# Patient Record
Sex: Female | Born: 1970 | Race: White | Hispanic: No | Marital: Married | Smoking: Never smoker
Health system: Southern US, Community
[De-identification: ages and names within clinical notes are randomized; demographics above are authoritative.]

## PROBLEM LIST (undated history)

## (undated) DIAGNOSIS — K219 Gastro-esophageal reflux disease without esophagitis: Secondary | ICD-10-CM

## (undated) DIAGNOSIS — G43909 Migraine, unspecified, not intractable, without status migrainosus: Secondary | ICD-10-CM

## (undated) DIAGNOSIS — E7800 Pure hypercholesterolemia, unspecified: Secondary | ICD-10-CM

## (undated) DIAGNOSIS — R109 Unspecified abdominal pain: Secondary | ICD-10-CM

## (undated) HISTORY — DX: Unspecified abdominal pain: R10.9

## (undated) HISTORY — DX: Gastro-esophageal reflux disease without esophagitis: K21.9

## (undated) HISTORY — PX: OVARIAN CYST REMOVAL: SHX89

## (undated) HISTORY — DX: Pure hypercholesterolemia, unspecified: E78.00

## (undated) HISTORY — PX: LAPAROSCOPIC OOPHERECTOMY: SHX6507

## (undated) HISTORY — DX: Migraine, unspecified, not intractable, without status migrainosus: G43.909

---

## 2017-04-05 ENCOUNTER — Emergency Department
Admission: EM | Admit: 2017-04-05 | Discharge: 2017-04-05 | Disposition: A | Discharge status: Home / Self Care | Payer: BLUE CROSS/BLUE SHIELD | Attending: Emergency Medicine | Admitting: Emergency Medicine

## 2017-04-05 ENCOUNTER — Encounter: Payer: Self-pay | Admitting: Emergency Medicine

## 2017-04-05 ENCOUNTER — Emergency Department: Payer: BLUE CROSS/BLUE SHIELD

## 2017-04-05 DIAGNOSIS — R0789 Other chest pain: Secondary | ICD-10-CM | POA: Insufficient documentation

## 2017-04-05 DIAGNOSIS — R110 Nausea: Secondary | ICD-10-CM | POA: Diagnosis not present

## 2017-04-05 DIAGNOSIS — Z79899 Other long term (current) drug therapy: Secondary | ICD-10-CM | POA: Diagnosis not present

## 2017-04-05 DIAGNOSIS — R079 Chest pain, unspecified: Secondary | ICD-10-CM

## 2017-04-05 LAB — CBC
HCT: 40.4 % (ref 35.0–47.0)
Hemoglobin: 13.7 g/dL (ref 12.0–16.0)
MCH: 29 pg (ref 26.0–34.0)
MCHC: 33.8 g/dL (ref 32.0–36.0)
MCV: 85.6 fL (ref 80.0–100.0)
PLATELETS: 319 10*3/uL (ref 150–440)
RBC: 4.72 MIL/uL (ref 3.80–5.20)
RDW: 13.9 % (ref 11.5–14.5)
WBC: 10.1 10*3/uL (ref 3.6–11.0)

## 2017-04-05 LAB — TROPONIN I: Troponin I: <0.03 ng/mL (ref <0.03)

## 2017-04-05 LAB — BASIC METABOLIC PANEL
Anion gap: 9 (ref 5–15)
BUN: 19 mg/dL (ref 6–20)
CALCIUM: 9.8 mg/dL (ref 8.9–10.3)
CHLORIDE: 105 mmol/L (ref 101–111)
CO2: 26 mmol/L (ref 22–32)
CREATININE: 1.01 mg/dL — AB (ref 0.44–1.00)
GFR calc non Af Amer: >60 mL/min (ref >60)
Glucose, Bld: 118 mg/dL — ABNORMAL HIGH (ref 65–99)
Potassium: 4.2 mmol/L (ref 3.5–5.1)
SODIUM: 140 mmol/L (ref 135–145)

## 2017-04-05 MED ORDER — ASPIRIN 81 MG PO CHEW
324.0000 mg | CHEWABLE_TABLET | Freq: Once | ORAL | Status: AC
  Administered 2017-04-05: 324 mg via ORAL
  Filled 2017-04-05: qty 4

## 2017-04-05 MED ORDER — NITROGLYCERIN 0.4 MG SL SUBL
0.4000 mg | SUBLINGUAL_TABLET | SUBLINGUAL | Status: DC | PRN
  Administered 2017-04-05: 0.4 mg via SUBLINGUAL
  Filled 2017-04-05: qty 1

## 2017-04-05 MED ORDER — GI COCKTAIL ~~LOC~~
30.0000 mL | Freq: Once | ORAL | Status: AC
  Administered 2017-04-05: 30 mL via ORAL
  Filled 2017-04-05: qty 30

## 2017-04-05 NOTE — ED Provider Notes (Signed)
[facility] Emergency Department Provider Note   ____________________________________________   First MD Initiated Contact with Patient 04/05/17 0407     (approximate)  I have reviewed the triage vital signs and the nursing notes.   HISTORY  Chief Complaint Chest Pain    HPI Kristie Meyer is a 46 y.o. female who comes into the hospital today with some tightness and pressure in her chest. She reports it started around 10 PM. She was getting ready for bed when this started. The patient was seen in [place] last week with some tightness and pressure in her epigastric area. She is being treated and reports that she was evaluated and is currently taking Dexacillin and meloxicam. She reports though that this is the first time at [facility] upper chest and she was concerned. The patient took some tramadol but it didn't help. She denies any radiation of the pain to her back neck or shoulders. The patient feels pressure when she lays back. She had an echocardiogram on Wednesday and is supposed to follow back up today. The patient had some slight nausea with no sweats but she did have chills. She is here for evaluation.The patient rates her pain a 4 out of 10 in intensity right now.   History reviewed. No pertinent past medical history.  There are no active problems to display for this patient.   Past Surgical History:  Procedure Laterality Date  . LAPAROSCOPIC OOPHERECTOMY    . OVARIAN CYST REMOVAL      Prior to Admission medications   Not on File    Allergies Patient has no known allergies.  No family history on file.  Social History Social History  Substance Use Topics  . Smoking status: Never Smoker  . Smokeless tobacco: Never Used  . Alcohol use No    Review of Systems  Constitutional: No fever/chills Eyes: No visual changes. ENT: No sore throat. Cardiovascular:  chest pain. Respiratory: Denies shortness of breath. Gastrointestinal:  Nausea with No abdominal pain.  no vomiting.  No diarrhea.  No constipation. Genitourinary: Negative for dysuria. Musculoskeletal: Negative for back pain. Skin: Negative for rash. Neurological: Negative for headaches, focal weakness or numbness.   ____________________________________________   PHYSICAL EXAM:  VITAL SIGNS: ED Triage Vitals  Enc Vitals Group     BP 04/05/17 0353 (!) 154/86     Pulse Rate 04/05/17 0353 67     Resp 04/05/17 0353 18     Temp 04/05/17 0353 98 F (36.7 C)     Temp Source 04/05/17 0353 Oral     SpO2 04/05/17 0353 100 %     Weight 04/05/17 0351 220 lb (99.8 kg)     Height 04/05/17 0351 5\' 3"  (1.6 m)     Head Circumference --      Peak Flow --      Pain Score 04/05/17 0351 4     Pain Loc --      Pain Edu? --      Excl. in GC? --     Constitutional: Alert and oriented. Well appearing and in Mild distress. Eyes: Conjunctivae are normal. PERRL. EOMI. Head: Atraumatic. Nose: No congestion/rhinnorhea. Mouth/Throat: Mucous membranes are moist.  Oropharynx non-erythematous. Cardiovascular: Normal rate, regular rhythm. Grossly normal heart sounds.  Good peripheral circulation. Respiratory: Normal respiratory effort.  No retractions. Lungs CTAB. Gastrointestinal: Soft and nontender. No distention. Positive bowel sounds Musculoskeletal: No lower extremity tenderness nor edema.   Neurologic:  Normal speech and language.  Skin:  Skin is warm, dry and intact. Marland Kitchen Psychiatric: Mood and affect are normal.  ____________________________________________   LABS (all labs ordered are listed, but only abnormal results are displayed)  Labs Reviewed  BASIC METABOLIC PANEL - Abnormal; Notable for the following:       Result Value   Glucose, Bld 118 (*)    Creatinine, Ser 1.01 (*)    All other components within normal limits  CBC  TROPONIN I  TROPONIN I   ____________________________________________  EKG  ED ECG REPORT I, Rebecka Apley, the  attending physician, personally viewed and interpreted this ECG.   Date: 04/05/2017  EKG Time: 348  Rate: 63  Rhythm: normal sinus rhythm  Axis: Normal  Intervals:none  ST&T Change: Biphasic T-wave in lead 3.  ____________________________________________  RADIOLOGY  CXR ____________________________________________   PROCEDURES  Procedure(s) performed: None  Procedures  Critical Care performed: No  ____________________________________________   INITIAL IMPRESSION / ASSESSMENT AND PLAN / ED COURSE  Pertinent labs & imaging results that were available during my care of the patient were reviewed by me and considered in my medical decision making (see chart for details).  This is a 46 year old female who comes into the hospital today with some chest pain. I will give the patient some aspirin and nitroglycerin to see if it resolves her pain at this time. I will await the results of the patient's blood work. She will be reassessed  Clinical Course as of Apr 06 723  Tue Apr 05, 2017  1610 No acute cardiopulmonary process seen. DG Chest 2 View [AW]    Clinical Course User Index [AW] Rebecka Apley, MD   The patient states that her pain is improved from previous. She does still have some discomfort but she reports it much improved than before. The patient has an appointment with her doctor today at 5:00. I encouraged her to follow up as she will be going to the results of her echocardiogram and her further testing. The patient will be discharged home to follow-up with her doctor. I will give the patient a GI cocktail as she has been treated for gastritis.  ____________________________________________   FINAL CLINICAL IMPRESSION(S) / ED DIAGNOSES  Final diagnoses:  Chest pain, unspecified type      NEW MEDICATIONS STARTED DURING THIS VISIT:  New Prescriptions   No medications on file     Note:  This document was prepared using Dragon voice recognition software  and may include unintentional dictation errors.    Rebecka Apley, MD 04/05/17 [phone]

## 2017-04-05 NOTE — ED Triage Notes (Signed)
Patient ambulatory to triage with steady gait, without difficulty or distress noted; pt reports upper CP tonight, nonradiating, [facility]; denies hx of same but has had a recent echo for mid epigastric pain (results pending)

## 2017-04-05 NOTE — ED Notes (Signed)
This RN to bedside at this time to introduce self to patient. Pt resting in bed at this time. Medication administered per MD order at this time. Pt visualized in NAD, respirations even and unlabored, skin warm, dry, and intact.

## 2017-04-05 NOTE — ED Notes (Signed)
NAD noted at time of D/C. Pt denies questions or concerns. Pt ambulatory to the lobby at this time. Pt refused wheelchair to the lobby.  

## 2017-04-05 NOTE — Discharge Instructions (Signed)
Please follow up with your physician for further evaluation of your chest pain. Also please follow up with cardiology

## 2017-04-05 NOTE — ED Notes (Signed)
Pt went to X-ray.   

## 2018-10-24 ENCOUNTER — Other Ambulatory Visit: Payer: Self-pay | Admitting: Family Medicine

## 2018-10-24 DIAGNOSIS — R251 Tremor, unspecified: Secondary | ICD-10-CM

## 2018-11-13 ENCOUNTER — Other Ambulatory Visit: Payer: Self-pay | Admitting: Family Medicine

## 2018-11-15 ENCOUNTER — Other Ambulatory Visit: Payer: BLUE CROSS/BLUE SHIELD

## 2018-11-21 ENCOUNTER — Other Ambulatory Visit: Payer: Self-pay | Admitting: Family Medicine

## 2018-11-22 ENCOUNTER — Other Ambulatory Visit: Payer: BLUE CROSS/BLUE SHIELD

## 2018-11-28 ENCOUNTER — Other Ambulatory Visit: Payer: BLUE CROSS/BLUE SHIELD

## 2018-12-18 ENCOUNTER — Ambulatory Visit
Admission: RE | Admit: 2018-12-18 | Discharge: 2018-12-18 | Disposition: A | Discharge status: Home / Self Care | Payer: BLUE CROSS/BLUE SHIELD | Source: Ambulatory Visit | Attending: Family Medicine | Admitting: Family Medicine

## 2018-12-18 DIAGNOSIS — R251 Tremor, unspecified: Secondary | ICD-10-CM

## 2018-12-18 MED ORDER — GADOBENATE DIMEGLUMINE 529 MG/ML IV SOLN
20.0000 mL | Freq: Once | INTRAVENOUS | Status: AC | PRN
  Administered 2018-12-18: 20 mL via INTRAVENOUS

## 2019-01-09 ENCOUNTER — Encounter: Payer: Self-pay | Admitting: Neurology

## 2019-01-09 ENCOUNTER — Ambulatory Visit (INDEPENDENT_AMBULATORY_CARE_PROVIDER_SITE_OTHER): Payer: BLUE CROSS/BLUE SHIELD | Admitting: Neurology

## 2019-01-09 VITALS — BP 130/85 | HR 76 | Ht 63.0 in | Wt 213.0 lb

## 2019-01-09 DIAGNOSIS — G250 Essential tremor: Secondary | ICD-10-CM

## 2019-01-09 NOTE — Progress Notes (Signed)
Subjective:    Patient ID: Kristie Meyer is a 48 y.o. female.  HPI     Kristie Foley, MD, PhD [facility] [address] [address] [place], [place] [address]  Dear Dr. Ria Comment,   I saw your patient, Kristie Meyer, upon your kind request in my neurologic clinic today for initial consultation of her tremors. The patient is unaccompanied today. As you know, Ms. Kristie Meyer is a 48 year old right-handed woman with an underlying medical history of headaches, vitamin D deficiency, reflux disease, hyperlipidemia, and obesity, who reports a head tremor for the past several months, perhaps a year, per husband's feedback to her. During a family vacation she was told that she may have had a head tremor for the past 2 or even 3 years per family report. She does not report a family history of tremor but is not aware of her dad's side of the family history, she did not actually know her biological father. She was adopted by her current father when she was about 48 years old. She has 2 half brothers, none with tremors. I reviewed your office note from 12/26/2018, which you kindly included. She had a head CT recently but results are not available for my review today. She had a recent brain MRI with and without contrast on 12/18/2018 and I reviewed the results: IMPRESSION: Slight premature atrophy. No acute intracranial findings. No abnormal postcontrast enhancement. No specific cause for head tremors is identified. Sinus disease as described. She does not smoke, does not drink alcohol, caffeine occasionally. She tries to hydrate well. She cleans houses for work. She has noticed no significant hand tremor with the exception of an occasional trembling when she is nervous. Her head tremor also becomes worse when she is stressed out or nervous.    Her Past Medical History Is Significant For: Past Medical History:  Diagnosis Date  . Abdominal pain, acute   . GERD (gastroesophageal  reflux disease)   . Hypercholesteremia   . Migraine headache     Her Past Surgical History Is Significant For: Past Surgical History:  Procedure Laterality Date  . LAPAROSCOPIC OOPHERECTOMY    . OVARIAN CYST REMOVAL      Her Family History Is Significant For: No family history on file.  Her Social History Is Significant For: Social History   Socioeconomic History  . Marital status: Married    Spouse name: Not on file  . Number of children: Not on file  . Years of education: Not on file  . Highest education level: Not on file  Occupational History  . Not on file  Social Needs  . Financial resource strain: Not on file  . Food insecurity:    Worry: Not on file    Inability: Not on file  . Transportation needs:    Medical: Not on file    Non-medical: Not on file  Tobacco Use  . Smoking status: Never Smoker  . Smokeless tobacco: Never Used  Substance and Sexual Activity  . Alcohol use: No  . Drug use: Not on file  . Sexual activity: Not on file  Lifestyle  . Physical activity:    Days per week: Not on file    Minutes per session: Not on file  . Stress: Not on file  Relationships  . Social connections:    Talks on phone: Not on file    Gets together: Not on file    Attends religious service: Not on file  Active member of club or organization: Not on file    Attends meetings of clubs or organizations: Not on file    Relationship status: Not on file  Other Topics Concern  . Not on file  Social History Narrative  . Not on file    Her Allergies Are:  Allergies  Allergen Reactions  . Latex   :   Her Current Medications Are:  Outpatient Encounter Medications as of 01/09/2019  Medication Sig  . omeprazole (PRILOSEC) 40 MG capsule Take 40 mg by mouth daily.  . [DISCONTINUED] dexlansoprazole (DEXILANT) 60 MG capsule Take 60 mg by mouth daily.  . [DISCONTINUED] meloxicam (MOBIC) 15 MG tablet Take 15 mg by mouth daily.  . [DISCONTINUED] traMADol (ULTRAM) 50 MG  tablet Take by mouth.   No facility-administered encounter medications on file as of 01/09/2019.   :   Review of Systems:  Out of a complete 14 point review of systems, all are reviewed and negative with the exception of these symptoms as listed below:  Review of Systems  Neurological:       Pt presents today to discuss her head and neck tremors. Pt reports that she has intermittent hand tremors. Pt is right handed.    Objective:  Neurological Exam  Physical Exam Physical Examination:   Vitals:   01/09/19 1012  BP: 130/85  Pulse: 76   General Examination: The patient is a very pleasant 48 y.o. female in no acute distress. She appears well-developed and well-nourished and well groomed.   HEENT: Normocephalic, atraumatic, pupils are equal, round and reactive to light and accommodation. Extraocular tracking is good without limitation to gaze excursion or nystagmus noted. Normal smooth pursuit is noted. Hearing is grossly intact. Face is symmetric with normal facial animation. Speech is clear with no dysarthria noted. There is no hypophonia. There is no lip, jaw or voice tremor. She has an intermittent head tremor, it is side to side. It is not continually noticeable, perhaps slightly distractible. Overall, it is mild. Neck is supple with full range of passive and active motion. There are no carotid bruits on auscultation. Oropharynx exam reveals: moderate mouth dryness, adequate dental hygiene. Tongue protrudes centrally and palate elevates symmetrically.   Chest: Clear to auscultation without wheezing, rhonchi or crackles noted.  Heart: S1+S2+0, regular and normal without murmurs, rubs or gallops noted.   Abdomen: Soft, non-tender and non-distended with normal bowel sounds appreciated on auscultation.  Extremities: There is no pitting edema in the distal lower extremities bilaterally. Pedal pulses are intact.  Skin: Warm and dry without trophic changes noted. There are no  varicosities.  Musculoskeletal: exam reveals no obvious joint deformities, tenderness or joint swelling or erythema.   Neurologically:  Mental status: The patient is awake, alert and oriented in all 4 spheres. Her immediate and remote memory, attention, language skills and fund of knowledge are appropriate. There is no evidence of aphasia, agnosia, apraxia or anomia. Speech is clear with normal prosody and enunciation. Thought process is linear. Mood is normal and affect is normal.  Cranial nerves II - XII are as described above under HEENT exam. In addition: shoulder shrug is normal with equal shoulder height noted. Motor exam: Normal bulk, strength and tone is noted. There is no resting tremor.  On 01/09/2019: on Archimedes spiral drawing, she has slight insecurity with her nondominant hand. Otherwise no significant tremor noted, handwriting is legible, not particularly tremulous, not micrographic. She has no significant postural or action tremor in her hands. She  has no lower extremity tremors. Romberg is negative. Reflexes are 2+ throughout. Babinski: Toes are flexor bilaterally. Fine motor skills and coordination: intact with normal finger taps, normal hand movements, normal rapid alternating patting, normal foot taps and normal foot agility.  Cerebellar testing: No dysmetria or intention tremor on finger to nose testing. Heel to shin is unremarkable bilaterally. There is no truncal or gait ataxia.  Sensory exam: intact to light touch, vibration, temperature in the upper and lower extremities.  Gait, station and balance: She stands easily. No veering to one side is noted. No leaning to one side is noted. Posture is age-appropriate and stance is narrow based. Gait shows normal stride length and normal pace. No problems turning are noted. Tandem walk is unremarkable.   Assessment and Plan:   In summary, Melana Rosmeri Peetz is a very pleasant 48 y.o.-year old female  with an underlying medical  history of headaches, vitamin D deficiency, reflux disease, hyperlipidemia, and obesity, who presents for evaluation of her head tremor. She has a mild and intermittent head tremor, no telltale signs of parkinsonism and is reassured in that regard. She has no significant hand tremor at this time. An isolated had tremors often benign. She may have a family history of tremor on her biological father side, history is unknown on that side. She is largely reassured. I would not favor trying any medication for an isolated had tremor as this can be difficult to treat and medications could potentially cause side effects before they may help with tremor. She's not particularly bothered by it. She is not limited in her activities of daily living from her tremor. At this juncture, I suggested as needed follow-up. She had no sinister findings on her recent brain MRI. She does report worsening of tremor with anxiety or stress. This is common trigger for exacerbation of any type of tremor. She is reminded to stay well-hydrated with water. From my end of things she can follow-up as needed. I answered all her questions today and she was in agreement. Thank you very much for allowing me to participate in the care of this nice patient. If I can be of any further assistance to you please do not hesitate to call me at [phone].  Sincerely,   Kristie Foley, MD, PhD

## 2019-01-09 NOTE — Patient Instructions (Signed)
You have a mild and intermittent head tremor.  You have no signs of parkinsonism, thankfully.  You may have a family history of tremors on your biological father's side.  I would not recommend any medication for your head tremor, as medications can cause side effects and head tremor does not respond very well to medication typically.   I can see you back as needed.

## 2019-03-29 ENCOUNTER — Other Ambulatory Visit: Payer: Self-pay

## 2019-03-29 ENCOUNTER — Emergency Department
Admission: EM | Admit: 2019-03-29 | Discharge: 2019-03-30 | Disposition: A | Discharge status: Home / Self Care | Payer: BLUE CROSS/BLUE SHIELD | Attending: Emergency Medicine | Admitting: Emergency Medicine

## 2019-03-29 ENCOUNTER — Telehealth: Payer: Self-pay

## 2019-03-29 ENCOUNTER — Encounter: Payer: Self-pay | Admitting: Emergency Medicine

## 2019-03-29 ENCOUNTER — Emergency Department: Payer: BLUE CROSS/BLUE SHIELD

## 2019-03-29 DIAGNOSIS — R059 Cough, unspecified: Secondary | ICD-10-CM

## 2019-03-29 DIAGNOSIS — Z79899 Other long term (current) drug therapy: Secondary | ICD-10-CM | POA: Diagnosis not present

## 2019-03-29 DIAGNOSIS — R05 Cough: Secondary | ICD-10-CM

## 2019-03-29 DIAGNOSIS — R0789 Other chest pain: Secondary | ICD-10-CM

## 2019-03-29 DIAGNOSIS — J069 Acute upper respiratory infection, unspecified: Secondary | ICD-10-CM | POA: Insufficient documentation

## 2019-03-29 DIAGNOSIS — Z20828 Contact with and (suspected) exposure to other viral communicable diseases: Secondary | ICD-10-CM | POA: Diagnosis not present

## 2019-03-29 LAB — CBC
HCT: 39.4 % (ref 36.0–46.0)
Hemoglobin: 13.3 g/dL (ref 12.0–15.0)
MCH: 28.9 pg (ref 26.0–34.0)
MCHC: 33.8 g/dL (ref 30.0–36.0)
MCV: 85.7 fL (ref 80.0–100.0)
Platelets: 331 10*3/uL (ref 150–400)
RBC: 4.6 MIL/uL (ref 3.87–5.11)
RDW: 12.8 % (ref 11.5–15.5)
WBC: 13.5 10*3/uL — ABNORMAL HIGH (ref 4.0–10.5)
nRBC: 0 % (ref 0.0–0.2)

## 2019-03-29 LAB — BASIC METABOLIC PANEL
Anion gap: 10 (ref 5–15)
BUN: 16 mg/dL (ref 6–20)
CO2: 25 mmol/L (ref 22–32)
Calcium: 9.7 mg/dL (ref 8.9–10.3)
Chloride: 104 mmol/L (ref 98–111)
Creatinine, Ser: 1.07 mg/dL — ABNORMAL HIGH (ref 0.44–1.00)
GFR calc Af Amer: >60 mL/min (ref >60)
GFR calc non Af Amer: >60 mL/min (ref >60)
Glucose, Bld: 126 mg/dL — ABNORMAL HIGH (ref 70–99)
Potassium: 4.1 mmol/L (ref 3.5–5.1)
Sodium: 139 mmol/L (ref 135–145)

## 2019-03-29 LAB — TROPONIN I: Troponin I: <0.03 ng/mL (ref <0.03)

## 2019-03-29 NOTE — Telephone Encounter (Signed)
Patient called the [facility] line undecided wheather or not she should go to the Emergency Room due to having chest pain. The patient reported that she has been having chest pain and tightness since 03/26/2019.  She said that her temperature was 99.4, little coughing and some diarrhea.   Patient reported that she hasn't been able to sleep since 03/26/2019.  Advised patient to go the ED to get checked to make sure that everything is okay.  Patient stated that she will go to [facility] ED, "it's only 10 mins from my home."

## 2019-03-29 NOTE — ED Triage Notes (Signed)
Patients waking up Monday night with pressure in middle of chest Denies radiation. Denies SOB, nausea or dizziness. Patient states she has had worsening cough over past few days. History of recurrent bronchitis.

## 2019-03-30 MED ORDER — BENZONATATE 100 MG PO CAPS: 100.0000 mg | ORAL_CAPSULE | Freq: Four times a day (QID) | ORAL | 0 refills | Status: AC | PRN

## 2019-03-30 NOTE — Discharge Instructions (Signed)
Return to the emergency room for any new or worrisome symptoms including low oxygen saturation, worsening cough, worsening chest pain, if you change your mind about further work-up about your chest pain, if you have persistent high fever.  We do recommend that you purchase a pulse ox machine and keep track of your oxygen saturation, if it goes below 91 consider seeking help.  In addition, We do suggest that you the emergency room if you have any change in your chest pain, or other concerns.  Please quarantine yourself from other people until your coronavirus test comes back in the next few days.  Follow-up closely with PCP if you do not have one please follow-up with the clinic listed above.

## 2019-03-30 NOTE — ED Provider Notes (Signed)
[facility] Emergency Department Provider Note  ____________________________________________   I have reviewed the triage vital signs and the nursing notes. Where available I have reviewed prior notes and, if possible and indicated, outside hospital notes.    HISTORY  Chief Complaint Cough and Chest Pain    HPI Kristie Meyer is a 48 y.o. female  patient seen and evaluated during the coronavirus epidemic during a time with low staffing history of reflux disease, history of "bronchitis" x2 in the last 8 months, no personal or family history of PE or DVT, has no tobacco abuse history, no known history of COPD, does have a history of hypercholesterol and migraine headaches, essential tremor, states that she has been coughing for last 2 to 3 days.  And when she coughs it hurts her chest.  It is in the costochondral margin as she indicates with her hand where it hurts.  Hurts to change position, hurts when she touches it, she does not have exertional discomfort.  Actually worse when she is lying down coughing.  No leg swelling no recent travel no recent surgery, she is not on exogenous estrogens.  She states that this pain is the same pain she gets every time she gets "bronchitis" which to her is a rhinorrhea and cough.  She is also worried that she may have the coronavirus.  She has no known exposures to it no recent travel.  Patient has this discomfort which is nonradiating, sharp in nature, persistent since Monday, with no variation except for those listed above.  No other alleviating or aggravating symptoms no exertional dyspnea.  Denies history of CAD did have a large chest pain work-up a few years ago for what was decided to be reflux disease.  Denies any abdominal pain nausea vomiting or change in stooling etc.    Past Medical History:  Diagnosis Date  . Abdominal pain, acute   . GERD (gastroesophageal reflux disease)   . Hypercholesteremia   . Migraine headache      There are no active problems to display for this patient.   Past Surgical History:  Procedure Laterality Date  . LAPAROSCOPIC OOPHERECTOMY    . OVARIAN CYST REMOVAL      Prior to Admission medications   Medication Sig Start Date End Date Taking? Authorizing Provider  omeprazole (PRILOSEC) 40 MG capsule Take 40 mg by mouth daily.   Yes [provider]    Allergies Latex  No family history on file.  Social History Social History   Tobacco Use  . Smoking status: Never Smoker  . Smokeless tobacco: Never Used  Substance Use Topics  . Alcohol use: No  . Drug use: Not on file    Review of Systems Constitutional: No fever/chills Eyes: No visual changes. ENT: No sore throat. No stiff neck no neck pain Cardiovascular: See HPI Respiratory: Denies shortness of breath. Gastrointestinal:   no vomiting.  No diarrhea.  No constipation. Genitourinary: Negative for dysuria. Musculoskeletal: Negative lower extremity swelling Skin: Negative for rash. Neurological: Negative for severe headaches, focal weakness or numbness.   ____________________________________________   PHYSICAL EXAM:  VITAL SIGNS: ED Triage Vitals  Enc Vitals Group     BP 03/29/19 2331 (!) 153/87     Pulse Rate 03/29/19 2331 77     Resp 03/29/19 2331 18     Temp 03/29/19 2331 99.3 F (37.4 C)     Temp Source 03/29/19 2331 Oral     SpO2 03/29/19 2331 97 %  Weight 03/29/19 2226 215 lb (97.5 kg)     Height 03/29/19 2226  (1.6 m)     Head Circumference --      Peak Flow --      Pain Score 03/29/19 2226 6     Pain Loc --      Pain Edu? --      Excl. in GC? --     Constitutional: Alert and oriented. Well appearing and in no acute distress. Eyes: Conjunctivae are normal Head: Atraumatic HEENT: Mild clear congestion/rhinnorhea. Mucous membranes are moist.  Oropharynx non-erythematous Neck:   Nontender with no meningismus, no masses, no stridor Cardiovascular: Normal rate, regular  rhythm. Grossly normal heart sounds.  Good peripheral circulation. Respiratory: Normal respiratory effort.  No retractions. Lungs CTAB. : Tender palpation along the left costochondral margin which reproduces the patient's discomfort.  There is no crepitus there is no flail chest there is no masses or lesions or erythema noted.  It is not consistent with shingles.  There is no rib fracture palpated.  Does exactly reproduce her discomfort when I touch this area when she touch there she states "ouch that the pain right there" and pulls back.  Also reproducible when she changes position in the bed pulls up on the bed or raises her arm.  This right at the insertion of the pectoralis muscle on the left. Abdominal: Soft and nontender. No distention. No guarding no rebound Back:  There is no focal tenderness or step off.  there is no midline tenderness there are no lesions noted. there is no CVA tenderness Musculoskeletal: No lower extremity tenderness, no upper extremity tenderness. No joint effusions, no DVT signs strong distal pulses no edema Neurologic:  Normal speech and language. No gross focal neurologic deficits are appreciated.  Skin:  Skin is warm, dry and intact. No rash noted. Psychiatric: Mood and affect are normal. Speech and behavior are normal.  ____________________________________________   LABS (all labs ordered are listed, but only abnormal results are displayed)  Labs Reviewed  BASIC METABOLIC PANEL - Abnormal; Notable for the following components:      Result Value   Glucose, Bld 126 (*)    Creatinine, Ser 1.07 (*)    All other components within normal limits  CBC - Abnormal; Notable for the following components:   WBC 13.5 (*)    All other components within normal limits  NOVEL CORONAVIRUS, NAA (HOSPITAL ORDER, SEND-OUT TO REF LAB)  TROPONIN I    Pertinent labs  results that were available during my care of the patient were reviewed by me and considered in my medical  decision making (see chart for details). ____________________________________________  EKG  I personally interpreted any EKGs ordered by me or triage Normal sinus rhythm rate 85 bpm there are nonspecific ST changes which appear to be similar to prior.  Normal axis. ____________________________________________  RADIOLOGY  Pertinent labs & imaging results that were available during my care of the patient were reviewed by me and considered in my medical decision making (see chart for details). If possible, patient and/or family made aware of any abnormal findings.  Dg Chest Portable 1 View  Result Date: 03/29/2019 CLINICAL DATA:  Cough and chest pain EXAM: PORTABLE CHEST 1 VIEW COMPARISON:  04/05/2017 FINDINGS: The heart size and mediastinal contours are within normal limits. Both lungs are clear. The visualized skeletal structures are unremarkable. IMPRESSION: No active disease. Electronically Signed   By: Deatra Robinson M.D.   On: 03/29/2019 22:55  ____________________________________________    PROCEDURES  Procedure(s) performed: None  Procedures  Critical Care performed: None  ____________________________________________   INITIAL IMPRESSION / ASSESSMENT AND PLAN / ED COURSE  Pertinent labs & imaging results that were available during my care of the patient were reviewed by me and considered in my medical decision making (see chart for details).  Patient with very reproducible chest wall pain, did consider but do not think present ACS PE dissection myocarditis endocarditis pneumonia pneumothorax and other intrathoracic pathology nor at this time do I think it likely the patient has referred abdominal pain causing this discomfort.  This is a mild rhinorrhea/early cough presentation with chest wall pain with cough which is very consistent with prior.  Is in no acute distress, she and I had a long talk about serial cardiac enzymes.  Her strong preference would not be to stay and  have them then although they were offered to her.  She understands limitation of the work-up in this context.  She states that she is pretty sure she just pulled a muscle coughing.  Stressed the cough is concerned, there is no clinical evidence to support pneumonia, she is not hypoxic there is no indication for admission per current coronavirus standards.  We will send an outpatient coronavirus test.  I have advised her to self quarantine pending those results and she states she will do so.  Extensive return precautions given for her chest pain and cough and she is very comfortable with this plan.  I have given her extensive instructions about returning and following up.  Advised that she buy herself a pulse ox machine to keep track of her oxygenation in the event that this does turn out to be the coronavirus.  Antibiotics are clearly not indicated at this patient with a mild cough with no chest x-ray findings and normal sats, At this time, there does not appear to be clinical evidence to support the diagnosis of pulmonary embolus, dissection, myocarditis, endocarditis, pericarditis, pericardial tamponade, acute coronary syndrome, pneumothorax, pneumonia, or any other acute intrathoracic pathology that will require admission or acute intervention. Nor is there evidence of any significant intra-abdominal pathology causing this discomfort.    ____________________________________________   FINAL CLINICAL IMPRESSION(S) / ED DIAGNOSES  Final diagnoses:  None      This chart was dictated using voice recognition software.  Despite best efforts to proofread,  errors can occur which can change meaning.      Jeanmarie PlantMcShane, James A, MD 03/30/19 [phone]

## 2019-03-31 LAB — NOVEL CORONAVIRUS, NAA (HOSP ORDER, SEND-OUT TO REF LAB; TAT 18-24 HRS): SARS-CoV-2, NAA: NOT DETECTED

## 2020-02-18 ENCOUNTER — Other Ambulatory Visit: Payer: Self-pay | Admitting: Internal Medicine

## 2020-02-18 DIAGNOSIS — Z1231 Encounter for screening mammogram for malignant neoplasm of breast: Secondary | ICD-10-CM

## 2020-02-26 ENCOUNTER — Ambulatory Visit
Admission: RE | Admit: 2020-02-26 | Discharge: 2020-02-26 | Disposition: A | Discharge status: Home / Self Care | Payer: BC Managed Care – PPO | Source: Ambulatory Visit | Attending: Internal Medicine | Admitting: Internal Medicine

## 2020-02-26 DIAGNOSIS — Z1231 Encounter for screening mammogram for malignant neoplasm of breast: Secondary | ICD-10-CM | POA: Insufficient documentation

## 2020-12-03 ENCOUNTER — Other Ambulatory Visit: Payer: Self-pay | Admitting: Surgery

## 2020-12-03 DIAGNOSIS — E042 Nontoxic multinodular goiter: Secondary | ICD-10-CM

## 2021-02-23 ENCOUNTER — Ambulatory Visit
Admission: RE | Admit: 2021-02-23 | Discharge: 2021-02-23 | Disposition: A | Discharge status: Home / Self Care | Payer: BC Managed Care – PPO | Source: Ambulatory Visit | Attending: Surgery | Admitting: Surgery

## 2021-02-23 ENCOUNTER — Other Ambulatory Visit: Payer: Self-pay

## 2021-02-23 DIAGNOSIS — E042 Nontoxic multinodular goiter: Secondary | ICD-10-CM | POA: Insufficient documentation

## 2021-05-05 ENCOUNTER — Other Ambulatory Visit: Payer: Self-pay | Admitting: Internal Medicine

## 2021-05-05 DIAGNOSIS — Z1231 Encounter for screening mammogram for malignant neoplasm of breast: Secondary | ICD-10-CM

## 2021-06-19 ENCOUNTER — Other Ambulatory Visit: Payer: Self-pay | Admitting: Obstetrics and Gynecology

## 2021-06-19 DIAGNOSIS — Z1231 Encounter for screening mammogram for malignant neoplasm of breast: Secondary | ICD-10-CM

## 2021-07-06 ENCOUNTER — Other Ambulatory Visit: Payer: Self-pay

## 2021-07-06 ENCOUNTER — Ambulatory Visit
Admission: RE | Admit: 2021-07-06 | Discharge: 2021-07-06 | Disposition: A | Discharge status: Home / Self Care | Payer: BC Managed Care – PPO | Source: Ambulatory Visit | Attending: Obstetrics and Gynecology | Admitting: Obstetrics and Gynecology

## 2021-07-06 DIAGNOSIS — Z1231 Encounter for screening mammogram for malignant neoplasm of breast: Secondary | ICD-10-CM | POA: Insufficient documentation

## 2021-09-04 ENCOUNTER — Emergency Department: Payer: BC Managed Care – PPO

## 2021-09-04 ENCOUNTER — Emergency Department
Admission: EM | Admit: 2021-09-04 | Discharge: 2021-09-05 | Disposition: A | Discharge status: Home / Self Care | Payer: BC Managed Care – PPO | Attending: Emergency Medicine | Admitting: Emergency Medicine

## 2021-09-04 ENCOUNTER — Other Ambulatory Visit: Payer: Self-pay

## 2021-09-04 DIAGNOSIS — Z91040 Latex allergy status: Secondary | ICD-10-CM | POA: Diagnosis not present

## 2021-09-04 DIAGNOSIS — R197 Diarrhea, unspecified: Secondary | ICD-10-CM | POA: Insufficient documentation

## 2021-09-04 DIAGNOSIS — R270 Ataxia, unspecified: Secondary | ICD-10-CM | POA: Insufficient documentation

## 2021-09-04 LAB — BASIC METABOLIC PANEL
Anion gap: 11 (ref 5–15)
BUN: 18 mg/dL (ref 6–20)
CO2: 26 mmol/L (ref 22–32)
Calcium: 9.6 mg/dL (ref 8.9–10.3)
Chloride: 101 mmol/L (ref 98–111)
Creatinine, Ser: 1.04 mg/dL — ABNORMAL HIGH (ref 0.44–1.00)
GFR, Estimated: >60 mL/min (ref >60)
Glucose, Bld: 113 mg/dL — ABNORMAL HIGH (ref 70–99)
Potassium: 4.2 mmol/L (ref 3.5–5.1)
Sodium: 138 mmol/L (ref 135–145)

## 2021-09-04 LAB — URINALYSIS, ROUTINE W REFLEX MICROSCOPIC
Bilirubin Urine: NEGATIVE
Glucose, UA: NEGATIVE mg/dL
Hgb urine dipstick: NEGATIVE
Ketones, ur: NEGATIVE mg/dL
Leukocytes,Ua: NEGATIVE
Nitrite: NEGATIVE
Protein, ur: NEGATIVE mg/dL
Specific Gravity, Urine: 1.01 (ref 1.005–1.030)
pH: 5 (ref 5.0–8.0)

## 2021-09-04 LAB — CBC
HCT: 43.2 % (ref 36.0–46.0)
Hemoglobin: 14.4 g/dL (ref 12.0–15.0)
MCH: 29.2 pg (ref 26.0–34.0)
MCHC: 33.3 g/dL (ref 30.0–36.0)
MCV: 87.6 fL (ref 80.0–100.0)
Platelets: 298 10*3/uL (ref 150–400)
RBC: 4.93 MIL/uL (ref 3.87–5.11)
RDW: 12.7 % (ref 11.5–15.5)
WBC: 7.7 10*3/uL (ref 4.0–10.5)
nRBC: 0 % (ref 0.0–0.2)

## 2021-09-04 NOTE — ED Provider Notes (Addendum)
[facility]  ____________________________________________   Event Date/Time   First MD Initiated Contact with Patient 09/04/21 1318     (approximate)  I have reviewed the triage vital signs and the nursing notes.   HISTORY  Chief Complaint Dizziness    HPI Nazyia Brit Carbonell is a 50 y.o. female pmh GERD, HTN, migraines presents with unsteadiness.  Patient notes that approximately 10 days ago she had acute onset of feeling unsteady on her feet.  She feels well when she is sitting down but then when she stands up and is walking she starts to feel heaviness on the right side of her head and feels like she is going to lose her balance.  She has not fallen.  She has to sit down and then feels improved.  She will often not have symptoms with standing.  She denies feeling lightheaded.  Occasionally does have a room spinning sensation.  Is not worse with head movement.  She denies associated diplopia dysarthria aphasia numbness or weakness.  Has never had this before.  Denies back pain or numbness and weakness in her legs.  She was seen at urgent care about a week ago and prescribed cefdinir for potential ear infection and meclizine.  She however denies any prior ear infection.  Has had some diarrhea since starting the antibiotic.         Past Medical History:  Diagnosis Date   Abdominal pain, acute    GERD (gastroesophageal reflux disease)    Hypercholesteremia    Migraine headache     There are no problems to display for this patient.   Past Surgical History:  Procedure Laterality Date   LAPAROSCOPIC OOPHERECTOMY     OVARIAN CYST REMOVAL      Prior to Admission medications   Medication Sig Start Date End Date Taking? Authorizing Provider  omeprazole (PRILOSEC) 40 MG capsule Take 40 mg by mouth daily.    [provider]    Allergies Bactrim [sulfamethoxazole-trimethoprim] and Latex  No family history on file.  Social History Social  History   Tobacco Use   Smoking status: Never   Smokeless tobacco: Never  Substance Use Topics   Alcohol use: No    Review of Systems   Review of Systems  Constitutional:  Negative for chills and fever.  Respiratory:  Negative for shortness of breath.   Cardiovascular:  Negative for chest pain, palpitations and leg swelling.  Gastrointestinal:  Positive for diarrhea. Negative for abdominal pain, nausea and vomiting.  Neurological:  Positive for tremors. Negative for speech difficulty, weakness, light-headedness, numbness and headaches.  All other systems reviewed and are negative.  Physical Exam Updated Vital Signs BP (!) 178/97   Pulse 62   Temp 98.6 F (37 C) (Oral)   Resp 16   Ht 5\' 3"  (1.6 m)   Wt 97.5 kg   SpO2 98%   BMI 38.09 kg/m   Physical Exam Vitals and nursing note reviewed.  Constitutional:      General: She is not in acute distress.    Appearance: Normal appearance.  HENT:     Head: Normocephalic and atraumatic.  Eyes:     General: No scleral icterus.    Conjunctiva/sclera: Conjunctivae normal.  Pulmonary:     Effort: Pulmonary effort is normal. No respiratory distress.     Breath sounds: No stridor.  Musculoskeletal:        General: No deformity or signs of injury.     Cervical back: Normal  range of motion.  Skin:    General: Skin is dry.     Coloration: Skin is not jaundiced or pale.  Neurological:     General: No focal deficit present.     Mental Status: She is alert and oriented to person, place, and time. Mental status is at baseline.     Comments: Aox3, nml speech  PERRL, EOMI, face symmetric, nml tongue movement  5/5 strength in the BL upper and lower extremities  Sensation grossly intact in the BL upper and lower extremities  Finger-nose-finger intact BL Pt stumbling intermittently when walking   Psychiatric:        Mood and Affect: Mood normal.        Behavior: Behavior normal.     LABS (all labs ordered are listed, but only  abnormal results are displayed)  Labs Reviewed  BASIC METABOLIC PANEL - Abnormal; Notable for the following components:      Result Value   Glucose, Bld 113 (*)    Creatinine, Ser 1.04 (*)    All other components within normal limits  URINALYSIS, ROUTINE W REFLEX MICROSCOPIC - Abnormal; Notable for the following components:   Color, Urine STRAW (*)    APPearance CLEAR (*)    All other components within normal limits  CBC  CBG MONITORING, ED  POC URINE PREG, ED   ____________________________________________  EKG  Normal sinus rhythm, normal axis, normal intervals, Q-wave inversions in V3 through V6, similar to prior  ____________________________________________  RADIOLOGY I, Randol Kern, personally viewed and evaluated these images (plain radiographs) as part of my medical decision making, as well as reviewing the written report by the radiologist.  ED MD interpretation: I reviewed the CT scan of the brain which does not show any acute intracranial process        ____________________________________________   PROCEDURES  Procedure(s) performed (including Critical Care):  Procedures   ____________________________________________   INITIAL IMPRESSION / ASSESSMENT AND PLAN / ED COURSE     The patient is a 50 year old female presents with about 10 days of difficulty walking.  She has no associated other neurologic symptoms.  She denies clear vertigo or presyncope syncope she has no numbness or weakness in her extremities.  On exam she does have subjective difficulty ambulating.  She will intermittently walk normally but then occasionally stumbles.  The rest of her neurologic exam however is nonfocal.  CT head was obtained from triage.  I am concerned about a central etiology of his ongoing ataxia.  We will obtain an MRI rule out cerebellar infarct or other central process.  If within normal limits she can likely be discharged with neurology follow-up. Patient's MRI  was normal.  Unclear what the underlying etiology of her gait instability is.  Possible that this is peripheral neuropathy versus presyncope.  With her reassuring CT and MRI and otherwise normal neurologic exam will discharge.  Referred her to neurology for follow-up.      ____________________________________________   FINAL CLINICAL IMPRESSION(S) / ED DIAGNOSES  Final diagnoses:  Ataxia     ED Discharge Orders     None        Note:  This document was prepared using Dragon voice recognition software and may include unintentional dictation errors.    Georga Hacking, MD 09/04/21 2221    Georga Hacking, MD 09/04/21 [phone]

## 2021-09-04 NOTE — Discharge Instructions (Addendum)
Your blood work was reassuring.  Your CT head and the MRI of your brain was also normal.  We have not determine exactly what is causing your unsteadiness.  It could be due to a problem with your peripheral nerves or potentially related to lightheadedness or syncope.  Please follow-up with neurology regarding this issue.  If you develop any new symptoms like chest pain, difficulty breathing or any new numbness or weakness, please return to the emergency department.

## 2021-09-04 NOTE — ED Triage Notes (Signed)
Pt here with dizziness and unsteady gait from [facility] for about a week. Pt denies pain and weakness. Pt endorses nausea when she is standing for too long. Pt recently finished a course of abx for a ear infx so she attributes her diarrhea to that. Pt in NAD in triage.

## 2021-09-04 NOTE — ED Provider Notes (Signed)
Emergency Medicine Provider Triage Evaluation Note  Kristie Meyer , a 50 y.o. female  was evaluated in triage.  Pt complains of dizziness and unsteady gait for 1.5 weeks.  Some nausea associated.  Patient does have an essential tremor..  Review of Systems  Positive: Dizziness Negative: Denies chest pain, shortness of breath, vomiting  Physical Exam  BP 99/79   Pulse 64   Temp 98 F (36.7 C) (Oral)   Resp 18   Ht 5\' 3"  (1.6 m)   Wt 97.5 kg   SpO2 96%   BMI 38.09 kg/m  Gen:   Awake, no distress   Resp:  Normal effort  MSK:   Moves extremities without difficulty  Other:    Medical Decision Making  Medically screening exam initiated at 1:15 PM.  Appropriate orders placed.  Kristie Meyer was informed that the remainder of the evaluation will be completed by another provider, this initial triage assessment does not replace that evaluation, and the importance of remaining in the ED until their evaluation is complete.  Patient presents from [place] [facility] for evaluation of dizziness x2 weeks.  Is been treated with medication that did not help.   [place], PA-C 09/04/21 1318    09/06/21, MD 09/04/21 [phone]

## 2021-09-04 NOTE — ED Triage Notes (Signed)
First Nurse Note: Arrives from [facility] for evaluation of dizziness, unsteady gait for 2 weeks.  Has been seen through [facility] several times for same.  AAOx3.  Skin warm and dry. NAD

## 2022-03-06 IMAGING — CT CT HEAD W/O CM
3 series · 16 of 47 positions shown, 19 images · non-contrast
Comparison: None.

CLINICAL DATA: Dizziness

EXAM:
CT HEAD WITHOUT CONTRAST
TECHNIQUE: Contiguous axial images were obtained from the base of the skull
through the vertex without intravenous contrast.

[Series 3: head wo · axial · 0.41mm/px · z∈[+317,+442]mm · 10 of 30 slices shown, 13 images]
[im 3/30  brain]
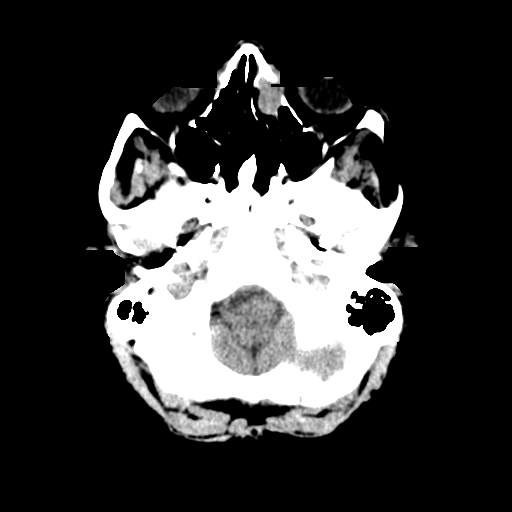
[im 3/30  bone]
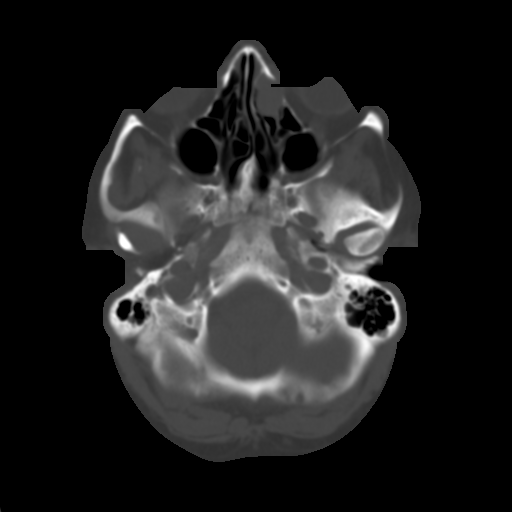
[im 6/30  brain]
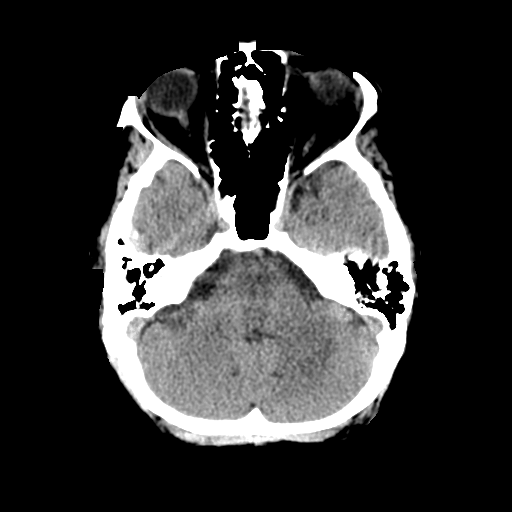
[im 9/30  brain]
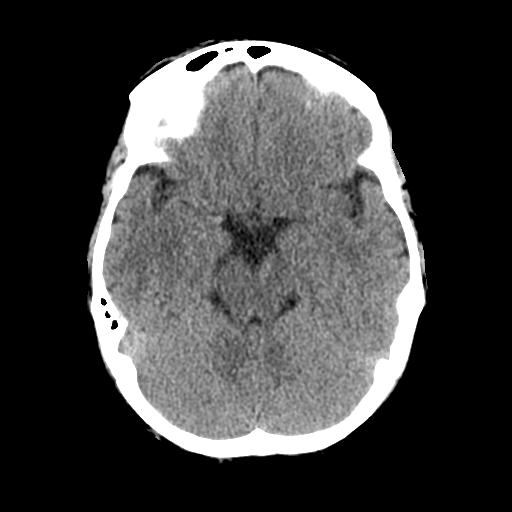
[im 11/30  brain]
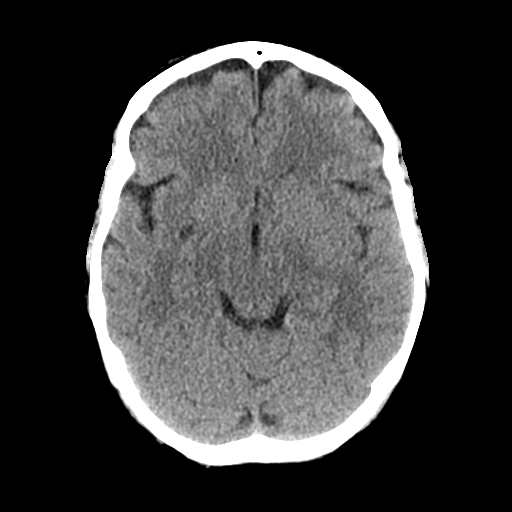
[im 14/30  brain]
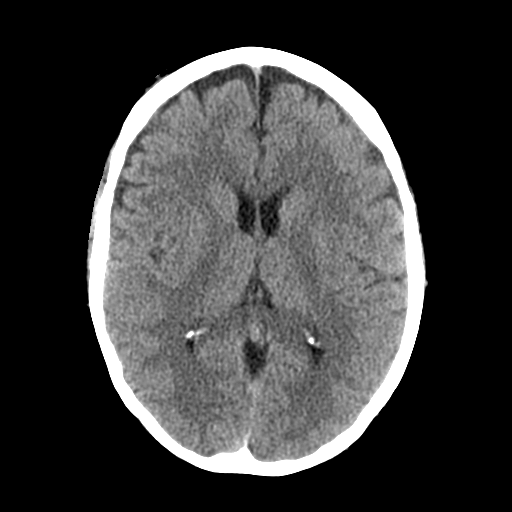
[im 14/30  bone]
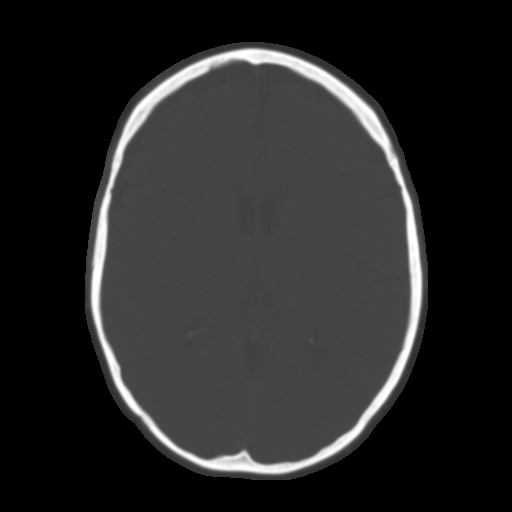
[im 17/30  brain]
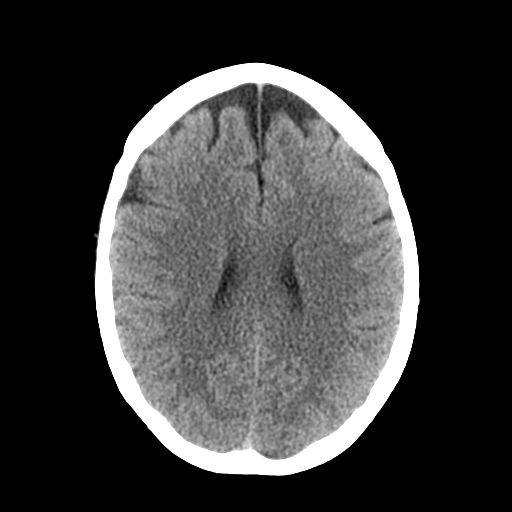
[im 20/30  brain]
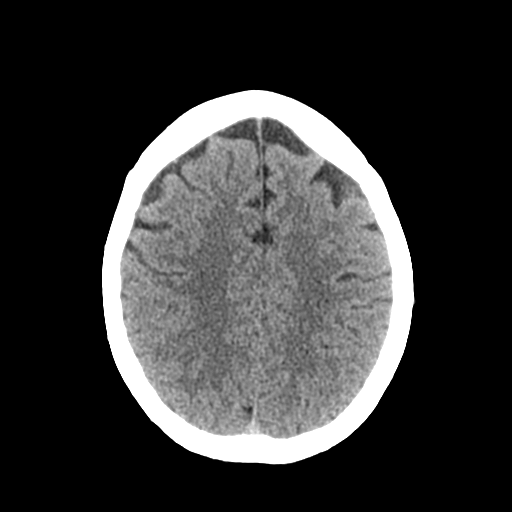
[im 23/30  brain]
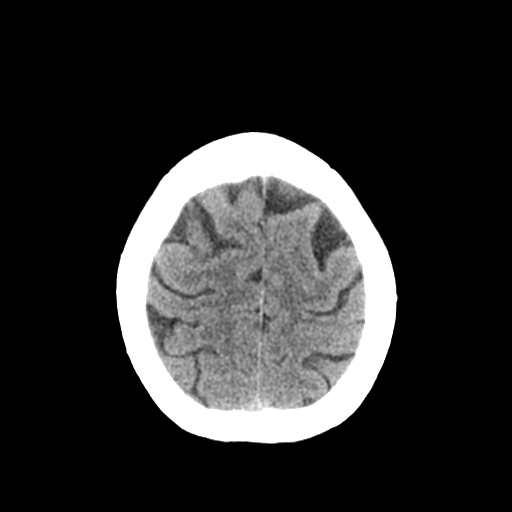
[im 25/30  brain]
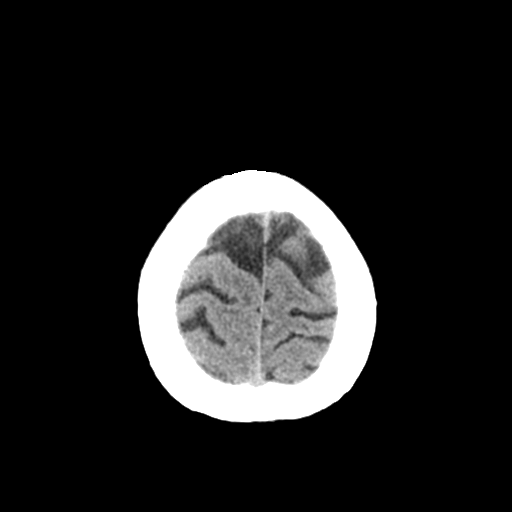
[im 25/30  bone]
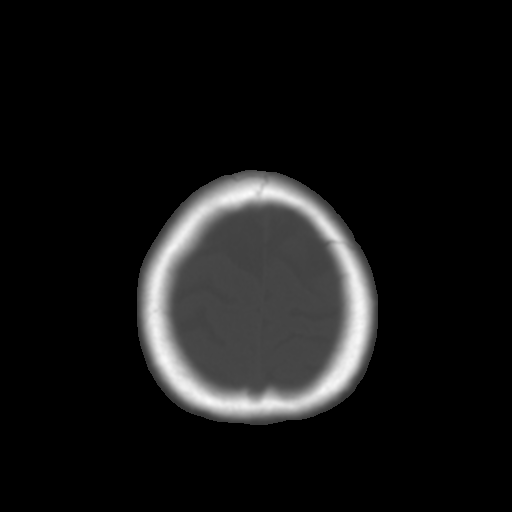
[im 28/30  brain]
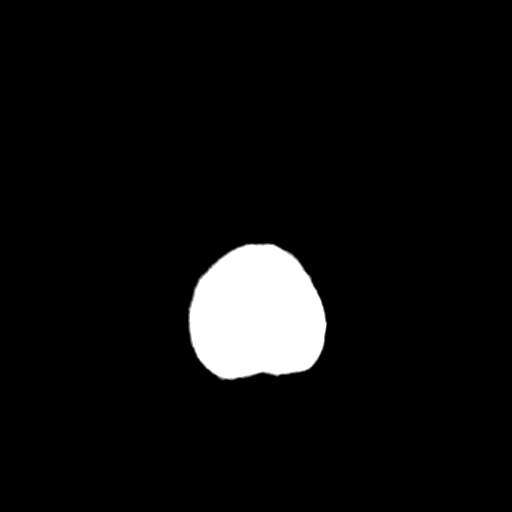

[Series 4: coronal soft tissue · coronal · 0.30mm/px · 3 of 62 slices shown]
[im 21/62  brain]
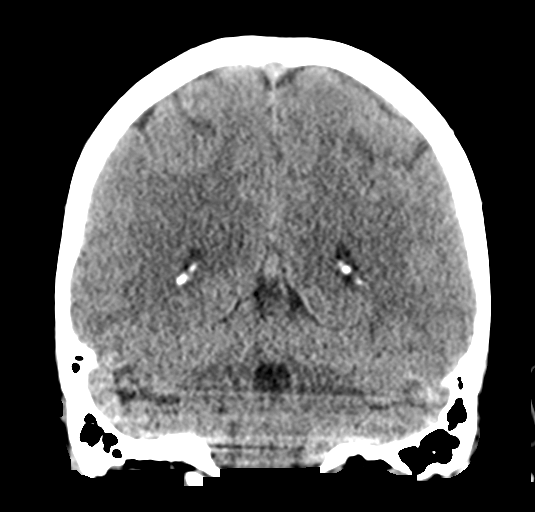
[im 28/62  brain]
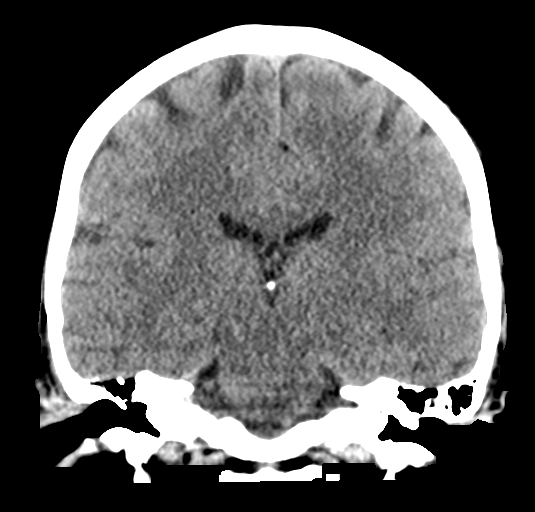
[im 34/62  brain]
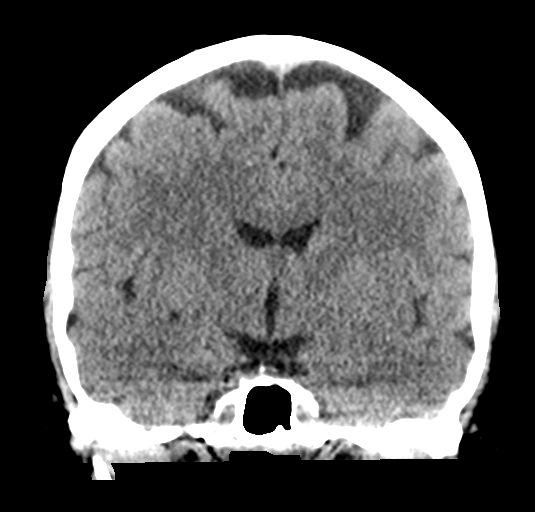

[Series 5: sagittal soft tissue · sagittal · 0.30mm/px · 3 of 50 slices shown]
[im 17/50  brain]
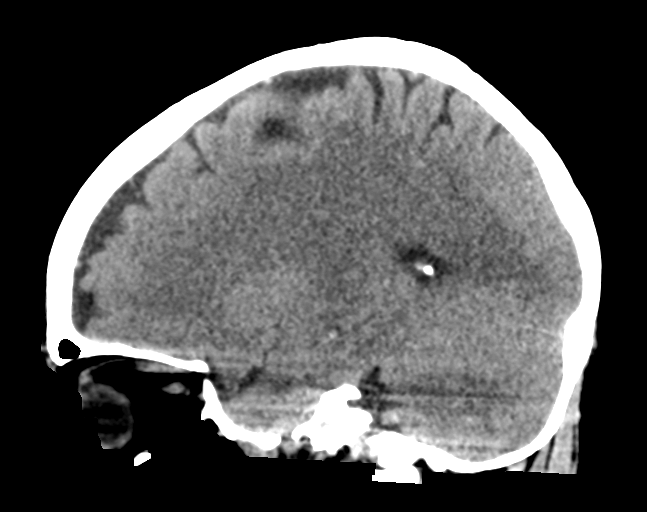
[im 25/50  brain]
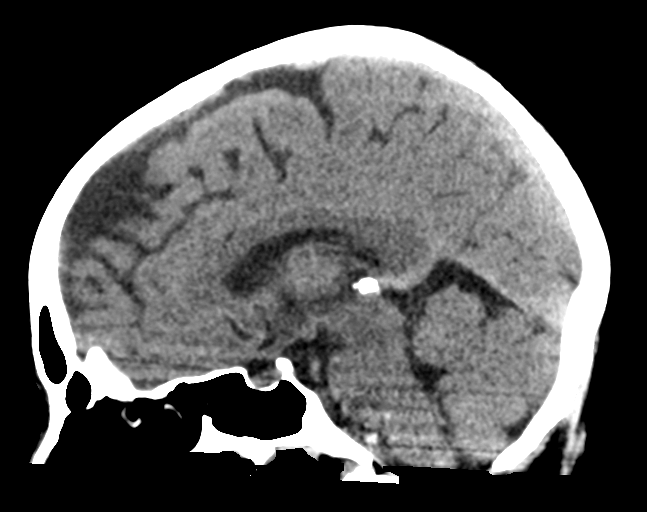
[im 33/50  brain]
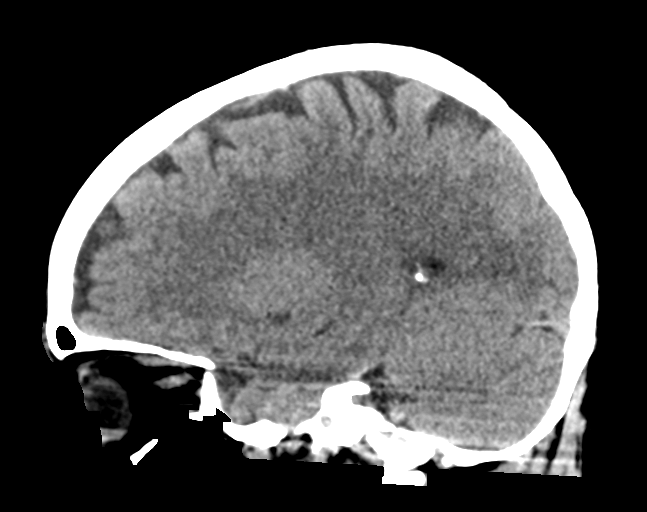

[16 of 47 positions shown; findings below may reference images not displayed]

FINDINGS: Brain: No evidence of acute infarction, hemorrhage, hydrocephalus,
extra-axial collection or mass lesion/mass effect.

Vascular: No hyperdense vessel or unexpected calcification.

Skull: Normal. Negative for fracture or focal lesion.

Sinuses/Orbits: No acute finding.

Other: None.
IMPRESSION: No acute intracranial abnormality.

## 2022-12-15 ENCOUNTER — Other Ambulatory Visit: Payer: Self-pay | Admitting: Internal Medicine

## 2022-12-15 DIAGNOSIS — Z1231 Encounter for screening mammogram for malignant neoplasm of breast: Secondary | ICD-10-CM

## 2023-03-31 ENCOUNTER — Ambulatory Visit
Admission: RE | Admit: 2023-03-31 | Discharge: 2023-03-31 | Disposition: A | Discharge status: Home / Self Care | Payer: BC Managed Care – PPO | Source: Ambulatory Visit | Attending: Internal Medicine | Admitting: Internal Medicine

## 2023-03-31 DIAGNOSIS — Z1231 Encounter for screening mammogram for malignant neoplasm of breast: Secondary | ICD-10-CM | POA: Diagnosis present

## 2023-07-12 ENCOUNTER — Ambulatory Visit: Payer: BC Managed Care – PPO

## 2023-07-12 DIAGNOSIS — Z1211 Encounter for screening for malignant neoplasm of colon: Secondary | ICD-10-CM | POA: Diagnosis present

## 2024-01-23 ENCOUNTER — Emergency Department
Admission: EM | Admit: 2024-01-23 | Discharge: 2024-01-24 | Disposition: A | Discharge status: Home / Self Care | Attending: Emergency Medicine | Admitting: Emergency Medicine

## 2024-01-23 ENCOUNTER — Other Ambulatory Visit: Payer: Self-pay

## 2024-01-23 DIAGNOSIS — Z23 Encounter for immunization: Secondary | ICD-10-CM | POA: Diagnosis not present

## 2024-01-23 DIAGNOSIS — S61211A Laceration without foreign body of left index finger without damage to nail, initial encounter: Secondary | ICD-10-CM | POA: Insufficient documentation

## 2024-01-23 DIAGNOSIS — W268XXA Contact with other sharp object(s), not elsewhere classified, initial encounter: Secondary | ICD-10-CM | POA: Insufficient documentation

## 2024-01-23 MED ORDER — LIDOCAINE HCL (PF) 1 % IJ SOLN
5.0000 mL | Freq: Once | INTRAMUSCULAR | Status: AC
  Administered 2024-01-24: 5 mL
  Filled 2024-01-23: qty 5

## 2024-01-23 MED ORDER — TETANUS-DIPHTH-ACELL PERTUSSIS 5-2.5-18.5 LF-MCG/0.5 IM SUSY
0.5000 mL | PREFILLED_SYRINGE | Freq: Once | INTRAMUSCULAR | Status: AC
  Administered 2024-01-24: 0.5 mL via INTRAMUSCULAR
  Filled 2024-01-23: qty 0.5

## 2024-01-23 NOTE — ED Triage Notes (Signed)
 Pt states she was using a rotary tool to cut fabric and sliced the tip of her left index finger. Bleeding controlled.

## 2024-01-24 DIAGNOSIS — S61211A Laceration without foreign body of left index finger without damage to nail, initial encounter: Secondary | ICD-10-CM | POA: Diagnosis not present

## 2024-01-24 MED ORDER — BACITRACIN ZINC 500 UNIT/GM EX OINT
TOPICAL_OINTMENT | Freq: Two times a day (BID) | CUTANEOUS | Status: DC
  Administered 2024-01-24: 1 via TOPICAL
  Filled 2024-01-24: qty 0.9

## 2024-01-24 NOTE — Discharge Instructions (Signed)
 See your doctor return to the emerged apartment in 7 to 10 days for suture removal. Take acetaminophen 650 mg and ibuprofen 400 mg every 6 hours for pain.  Take with food.  Please keep your wound clean by washing at least daily with soap and water.  Apply antibiotic ointment and bandage. If you see any signs of infection like spreading redness, pus coming from the wound, extreme pain, fevers, chills or any other worsening doctor right away or come back to the emergency department   Thank you for choosing [place] for your health care today!  Please see your primary doctor this week for a follow up appointment.   If you have any new, worsening, or unexpected symptoms call your doctor right away or come back to the emergency department for reevaluation.  It was my pleasure to care for you today.   Daneil Dan Modesto Charon, MD

## 2024-01-24 NOTE — ED Provider Notes (Signed)
 [facility] Provider Note    Event Date/Time   First MD Initiated Contact with Patient 01/23/24 2345     (approximate)   History   Laceration   HPI  Kristie Meyer is a 53 y.o. female   Past medical history of no pertinent past medical history presents emergency department with hand injury, cut her left index finger at the tip while cutting fabric.  No other injuries noted. Independent Historian contributed to assessment above: Family member is at bedside to corroborate information past medical history above       Physical Exam   Triage Vital Signs: ED Triage Vitals  Encounter Vitals Group     BP 01/23/24 1930 (!) 126/103     Systolic BP Percentile --      Diastolic BP Percentile --      Pulse Rate 01/23/24 1930 (!) 55     Resp 01/23/24 1930 18     Temp 01/23/24 1930 98.7 F (37.1 C)     Temp Source 01/23/24 1930 Oral     SpO2 01/23/24 1930 98 %     Weight 01/23/24 1929 215 lb (97.5 kg)     Height 01/23/24 1929 5\' 3"  (1.6 m)     Head Circumference --      Peak Flow --      Pain Score 01/23/24 1929 5     Pain Loc --      Pain Education --      Exclude from Growth Chart --     Most recent vital signs: Vitals:   01/24/24 0002 01/24/24 0212  BP: (!) 154/86 (!) 128/57  Pulse: (!) 59 (!) 59  Resp: 16 19  Temp: 98 F (36.7 C) 97.7 F (36.5 C)  SpO2: 98% 99%    General: Awake, no distress.  CV:  Good peripheral perfusion.  Resp:  Normal effort.  Abd:  No distention.  Other:  Pleasant woman in no acute distress.  There is a small laceration to the tip of the left index finger adjacent to the nail but does not involve the nailbed.  Hemostatic.  Brisk cap refill.  Full range of motion to the digit.   ED Results / Procedures / Treatments   Labs (all labs ordered are listed, but only abnormal results are displayed) Labs Reviewed - No data to display   PROCEDURES:  Critical Care performed: No  .Laceration  Repair  Date/Time: 01/24/2024 8:27 AM  Performed by: Pilar Jarvis, MD Authorized by: Pilar Jarvis, MD   Consent:    Consent obtained:  Verbal   Consent given by:  Patient   Risks discussed:  Infection, poor wound healing and pain   Alternatives discussed:  No treatment and delayed treatment Universal protocol:    Procedure explained and questions answered to patient or proxy's satisfaction: yes     Patient identity confirmed:  Verbally with patient Anesthesia:    Anesthesia method:  Nerve block   Block location:  Digital block   Block needle gauge:  27 G   Block anesthetic:  Lidocaine 1% w/o epi   Block technique:  Digital block   Block injection procedure:  Introduced needle, negative aspiration for blood, anatomic landmarks palpated, anatomic landmarks identified and incremental injection   Block outcome:  Anesthesia achieved Laceration details:    Location:  Finger   Finger location:  L index finger   Length (cm):  1   Depth (mm):  2 Exploration:    Hemostasis  achieved with:  Direct pressure Treatment:    Area cleansed with:  Povidone-iodine   Irrigation solution:  Sterile saline   Irrigation method:  Syringe Skin repair:    Repair method:  Sutures   Suture size:  6-0   Suture material:  Nylon   Suture technique:  Simple interrupted   Number of sutures:  2 Approximation:    Approximation:  Close Repair type:    Repair type:  Simple Post-procedure details:    Dressing:  Antibiotic ointment and adhesive bandage   Procedure completion:  Tolerated    MEDICATIONS ORDERED IN ED: Medications  lidocaine (PF) (XYLOCAINE) 1 % injection 5 mL (5 mLs Other Given 01/24/24 0005)  Tdap (BOOSTRIX) injection 0.5 mL (0.5 mLs Intramuscular Given 01/24/24 0006)    IMPRESSION / MDM / ASSESSMENT AND PLAN / ED COURSE  I reviewed the triage vital signs and the nursing notes.                                Patient's presentation is most consistent with acute complicated illness /  injury requiring diagnostic workup.  Differential diagnosis includes, but is not limited to, laceration, nailbed injury, neurovascular injury or tendon injury   MDM:   Small laceration repaired well and tolerated well, no evidence of neurovascular or tendon injury.  Anticipatory guidance given and she was discharged.       FINAL CLINICAL IMPRESSION(S) / ED DIAGNOSES   Final diagnoses:  Laceration of left index finger without foreign body without damage to nail, initial encounter     Rx / DC Orders   ED Discharge Orders     None        Note:  This document was prepared using Dragon voice recognition software and may include unintentional dictation errors.    Pilar Jarvis, MD 01/24/24 [phone]
